# Patient Record
Sex: Female | Born: 1990 | Race: White | Hispanic: No | Marital: Single
Health system: Southern US, Community
[De-identification: ages and names within clinical notes are randomized; demographics above are authoritative.]

---

## 2012-01-01 ENCOUNTER — Emergency Department: Payer: Self-pay | Admitting: Emergency Medicine

## 2012-01-01 LAB — PREGNANCY, URINE: Pregnancy Test, Urine: NEGATIVE m[IU]/mL

## 2012-12-01 ENCOUNTER — Emergency Department: Payer: Self-pay | Admitting: Emergency Medicine

## 2012-12-01 LAB — URINALYSIS, COMPLETE
Bilirubin,UR: NEGATIVE
Blood: NEGATIVE
Glucose,UR: NEGATIVE mg/dL (ref 0–75)
Ketone: NEGATIVE
Leukocyte Esterase: NEGATIVE
Nitrite: NEGATIVE
Ph: 5 (ref 4.5–8.0)
RBC,UR: 1 /HPF (ref 0–5)
Squamous Epithelial: 2
WBC UR: 1 /HPF (ref 0–5)

## 2012-12-01 LAB — CBC
HGB: 11.7 g/dL — ABNORMAL LOW (ref 12.0–16.0)
MCHC: 33.5 g/dL (ref 32.0–36.0)
MCV: 89 fL (ref 80–100)
Platelet: 186 10*3/uL (ref 150–440)
RBC: 3.96 10*6/uL (ref 3.80–5.20)
RDW: 15.7 % — ABNORMAL HIGH (ref 11.5–14.5)

## 2012-12-01 LAB — COMPREHENSIVE METABOLIC PANEL
Albumin: 3.7 g/dL — ABNORMAL LOW (ref 3.8–5.6)
Alkaline Phosphatase: 66 U/L — ABNORMAL LOW (ref 82–169)
Anion Gap: 3 — ABNORMAL LOW (ref 7–16)
BUN: 14 mg/dL (ref 7–18)
Calcium, Total: 8.7 mg/dL — ABNORMAL LOW (ref 9.0–10.7)
Co2: 27 mmol/L (ref 21–32)
EGFR (African American): >60
EGFR (Non-African Amer.): >60
Osmolality: 279 (ref 275–301)
SGOT(AST): 18 U/L (ref 0–26)
Total Protein: 7 g/dL (ref 6.4–8.6)
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
## 2018-09-18 ENCOUNTER — Emergency Department (HOSPITAL_COMMUNITY)
Admission: EM | Admit: 2018-09-18 | Discharge: 2018-09-18 | Disposition: A | Discharge status: Home / Self Care | Payer: Self-pay | Attending: Emergency Medicine | Admitting: Emergency Medicine

## 2018-09-18 ENCOUNTER — Other Ambulatory Visit: Payer: Self-pay

## 2018-09-18 ENCOUNTER — Emergency Department (HOSPITAL_COMMUNITY): Payer: Self-pay

## 2018-09-18 DIAGNOSIS — T1490XA Injury, unspecified, initial encounter: Secondary | ICD-10-CM

## 2018-09-18 DIAGNOSIS — S50812A Abrasion of left forearm, initial encounter: Secondary | ICD-10-CM | POA: Insufficient documentation

## 2018-09-18 DIAGNOSIS — Y92410 Unspecified street and highway as the place of occurrence of the external cause: Secondary | ICD-10-CM | POA: Insufficient documentation

## 2018-09-18 DIAGNOSIS — S90811A Abrasion, right foot, initial encounter: Secondary | ICD-10-CM | POA: Insufficient documentation

## 2018-09-18 DIAGNOSIS — S50811A Abrasion of right forearm, initial encounter: Secondary | ICD-10-CM | POA: Insufficient documentation

## 2018-09-18 DIAGNOSIS — S80211A Abrasion, right knee, initial encounter: Secondary | ICD-10-CM | POA: Insufficient documentation

## 2018-09-18 DIAGNOSIS — T07XXXA Unspecified multiple injuries, initial encounter: Secondary | ICD-10-CM

## 2018-09-18 DIAGNOSIS — Y999 Unspecified external cause status: Secondary | ICD-10-CM | POA: Insufficient documentation

## 2018-09-18 DIAGNOSIS — S30811A Abrasion of abdominal wall, initial encounter: Secondary | ICD-10-CM | POA: Insufficient documentation

## 2018-09-18 DIAGNOSIS — Y9389 Activity, other specified: Secondary | ICD-10-CM | POA: Insufficient documentation

## 2018-09-18 MED ORDER — HYDROMORPHONE HCL 1 MG/ML IJ SOLN
1.0000 mg | Freq: Once | INTRAMUSCULAR | Status: AC
  Administered 2018-09-18: 1 mg via INTRAVENOUS

## 2018-09-18 MED ORDER — ACETAMINOPHEN 500 MG PO TABS
1000.0000 mg | ORAL_TABLET | Freq: Once | ORAL | Status: AC
  Administered 2018-09-18: 1000 mg via ORAL
  Filled 2018-09-18: qty 2

## 2018-09-18 MED ORDER — ACETAMINOPHEN 500 MG PO TABS: 1000.0000 mg | ORAL_TABLET | Freq: Three times a day (TID) | ORAL | 0 refills | Status: AC

## 2018-09-18 MED ORDER — SODIUM CHLORIDE 0.9 % IV BOLUS
1000.0000 mL | Freq: Once | INTRAVENOUS | Status: AC
  Administered 2018-09-18: 1000 mL via INTRAVENOUS

## 2018-09-18 MED ORDER — KETOROLAC TROMETHAMINE 15 MG/ML IJ SOLN
15.0000 mg | Freq: Once | INTRAMUSCULAR | Status: AC
  Administered 2018-09-18: 15 mg via INTRAVENOUS
  Filled 2018-09-18: qty 1

## 2018-09-18 NOTE — ED Notes (Signed)
Security at bedside

## 2018-09-18 NOTE — ED Notes (Signed)
Phlebotomy at bedside

## 2018-09-18 NOTE — ED Notes (Signed)
Chaplain at Bedside
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
## 2018-09-18 NOTE — ED Notes (Signed)
Xray at bedside

## 2018-09-18 NOTE — ED Notes (Signed)
[place] pts mother wants to know about pt status
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
## 2018-09-18 NOTE — ED Notes (Signed)
Ortho tech at bedside

## 2018-09-18 NOTE — ED Notes (Addendum)
EDP at bedside
